# Patient Record
Sex: Male | Born: 1960 | Hispanic: Refuse to answer | Smoking: Never smoker
Health system: Southern US, Community
[De-identification: ages and names within clinical notes are randomized; demographics above are authoritative.]

## PROBLEM LIST (undated history)

## (undated) DIAGNOSIS — S83209A Unspecified tear of unspecified meniscus, current injury, unspecified knee, initial encounter: Secondary | ICD-10-CM

## (undated) DIAGNOSIS — M1990 Unspecified osteoarthritis, unspecified site: Secondary | ICD-10-CM

## (undated) DIAGNOSIS — E042 Nontoxic multinodular goiter: Secondary | ICD-10-CM

## (undated) DIAGNOSIS — E0590 Thyrotoxicosis, unspecified without thyrotoxic crisis or storm: Secondary | ICD-10-CM

---

## 1982-11-09 HISTORY — PX: OTHER SURGICAL HISTORY: SHX169

## 1998-02-11 ENCOUNTER — Ambulatory Visit (HOSPITAL_COMMUNITY): Admission: RE | Admit: 1998-02-11 | Discharge: 1998-02-11 | Payer: Self-pay | Admitting: *Deleted

## 1998-12-19 ENCOUNTER — Ambulatory Visit (HOSPITAL_COMMUNITY): Admission: RE | Admit: 1998-12-19 | Discharge: 1998-12-19 | Payer: Self-pay | Admitting: Obstetrics and Gynecology

## 1999-01-19 ENCOUNTER — Ambulatory Visit (HOSPITAL_COMMUNITY): Admission: RE | Admit: 1999-01-19 | Discharge: 1999-01-19 | Payer: Self-pay | Admitting: Obstetrics and Gynecology

## 1999-12-01 ENCOUNTER — Emergency Department (HOSPITAL_COMMUNITY): Admission: EM | Admit: 1999-12-01 | Discharge: 1999-12-01 | Payer: Self-pay | Admitting: *Deleted

## 2002-03-30 ENCOUNTER — Encounter: Payer: Self-pay | Admitting: Emergency Medicine

## 2002-03-30 ENCOUNTER — Emergency Department (HOSPITAL_COMMUNITY): Admission: EM | Admit: 2002-03-30 | Discharge: 2002-03-30 | Payer: Self-pay | Admitting: Emergency Medicine

## 2006-05-05 ENCOUNTER — Ambulatory Visit: Payer: Self-pay | Admitting: Internal Medicine

## 2006-06-16 ENCOUNTER — Ambulatory Visit: Payer: Self-pay | Admitting: Internal Medicine

## 2008-01-25 ENCOUNTER — Encounter: Payer: Self-pay | Admitting: *Deleted

## 2008-01-25 DIAGNOSIS — S42309A Unspecified fracture of shaft of humerus, unspecified arm, initial encounter for closed fracture: Secondary | ICD-10-CM | POA: Insufficient documentation

## 2008-01-25 DIAGNOSIS — F411 Generalized anxiety disorder: Secondary | ICD-10-CM | POA: Insufficient documentation

## 2008-01-25 DIAGNOSIS — J309 Allergic rhinitis, unspecified: Secondary | ICD-10-CM | POA: Insufficient documentation

## 2008-01-25 DIAGNOSIS — Z8719 Personal history of other diseases of the digestive system: Secondary | ICD-10-CM

## 2008-01-25 DIAGNOSIS — B351 Tinea unguium: Secondary | ICD-10-CM

## 2008-01-25 DIAGNOSIS — G478 Other sleep disorders: Secondary | ICD-10-CM | POA: Insufficient documentation

## 2012-03-08 ENCOUNTER — Other Ambulatory Visit: Payer: Self-pay | Admitting: Internal Medicine

## 2012-03-08 DIAGNOSIS — E041 Nontoxic single thyroid nodule: Secondary | ICD-10-CM

## 2012-03-15 ENCOUNTER — Ambulatory Visit
Admission: RE | Admit: 2012-03-15 | Discharge: 2012-03-15 | Disposition: A | Discharge status: Home / Self Care | Payer: BC Managed Care – PPO | Source: Ambulatory Visit | Attending: Internal Medicine | Admitting: Internal Medicine

## 2012-03-15 DIAGNOSIS — E041 Nontoxic single thyroid nodule: Secondary | ICD-10-CM

## 2012-04-12 ENCOUNTER — Other Ambulatory Visit: Payer: Self-pay | Admitting: Orthopedic Surgery

## 2012-04-12 ENCOUNTER — Encounter (HOSPITAL_BASED_OUTPATIENT_CLINIC_OR_DEPARTMENT_OTHER): Payer: Self-pay | Admitting: *Deleted

## 2012-04-12 NOTE — Progress Notes (Signed)
NPO AFTER MN. ARRIVES AT 1100. (PT SON HAS 5TH GRADE GRADUATION DOS) NEEDS HG AND EKG.

## 2012-04-13 ENCOUNTER — Encounter (HOSPITAL_BASED_OUTPATIENT_CLINIC_OR_DEPARTMENT_OTHER): Payer: Self-pay | Admitting: Anesthesiology

## 2012-04-13 ENCOUNTER — Encounter (HOSPITAL_BASED_OUTPATIENT_CLINIC_OR_DEPARTMENT_OTHER)
Admission: RE | Disposition: A | Discharge status: Home / Self Care | Payer: Self-pay | Source: Ambulatory Visit | Attending: Orthopedic Surgery

## 2012-04-13 ENCOUNTER — Ambulatory Visit (HOSPITAL_BASED_OUTPATIENT_CLINIC_OR_DEPARTMENT_OTHER): Payer: BC Managed Care – PPO | Admitting: Anesthesiology

## 2012-04-13 ENCOUNTER — Ambulatory Visit (HOSPITAL_BASED_OUTPATIENT_CLINIC_OR_DEPARTMENT_OTHER)
Admission: RE | Admit: 2012-04-13 | Discharge: 2012-04-13 | Disposition: A | Discharge status: Home / Self Care | Payer: BC Managed Care – PPO | Source: Ambulatory Visit | Attending: Orthopedic Surgery | Admitting: Orthopedic Surgery

## 2012-04-13 ENCOUNTER — Encounter (HOSPITAL_BASED_OUTPATIENT_CLINIC_OR_DEPARTMENT_OTHER): Payer: Self-pay | Admitting: *Deleted

## 2012-04-13 DIAGNOSIS — M659 Unspecified synovitis and tenosynovitis, unspecified site: Secondary | ICD-10-CM | POA: Insufficient documentation

## 2012-04-13 DIAGNOSIS — Z9889 Other specified postprocedural states: Secondary | ICD-10-CM

## 2012-04-13 DIAGNOSIS — Z7982 Long term (current) use of aspirin: Secondary | ICD-10-CM | POA: Insufficient documentation

## 2012-04-13 DIAGNOSIS — X58XXXA Exposure to other specified factors, initial encounter: Secondary | ICD-10-CM | POA: Insufficient documentation

## 2012-04-13 DIAGNOSIS — Z79899 Other long term (current) drug therapy: Secondary | ICD-10-CM | POA: Insufficient documentation

## 2012-04-13 DIAGNOSIS — M129 Arthropathy, unspecified: Secondary | ICD-10-CM | POA: Insufficient documentation

## 2012-04-13 DIAGNOSIS — IMO0002 Reserved for concepts with insufficient information to code with codable children: Secondary | ICD-10-CM | POA: Insufficient documentation

## 2012-04-13 HISTORY — DX: Thyrotoxicosis, unspecified without thyrotoxic crisis or storm: E05.90

## 2012-04-13 HISTORY — DX: Unspecified osteoarthritis, unspecified site: M19.90

## 2012-04-13 HISTORY — DX: Nontoxic multinodular goiter: E04.2

## 2012-04-13 HISTORY — PX: KNEE ARTHROSCOPY: SHX127

## 2012-04-13 HISTORY — DX: Unspecified tear of unspecified meniscus, current injury, unspecified knee, initial encounter: S83.209A

## 2012-04-13 SURGERY — ARTHROSCOPY, KNEE
Anesthesia: General | Site: Knee | Laterality: Left | Wound class: Clean

## 2012-04-13 MED ORDER — FENTANYL CITRATE 0.05 MG/ML IJ SOLN: 25.0000 ug | INTRAMUSCULAR | Status: DC | PRN

## 2012-04-13 MED ORDER — ONDANSETRON HCL 4 MG/2ML IJ SOLN
INTRAMUSCULAR | Status: DC | PRN
  Administered 2012-04-13 (×2): 2 mg via INTRAVENOUS

## 2012-04-13 MED ORDER — OXYCODONE-ACETAMINOPHEN 7.5-325 MG PO TABS: 1.0000 | ORAL_TABLET | ORAL | Status: AC | PRN

## 2012-04-13 MED ORDER — MORPHINE SULFATE 4 MG/ML IJ SOLN
INTRAMUSCULAR | Status: DC | PRN
  Administered 2012-04-13: 4 mg

## 2012-04-13 MED ORDER — METHOCARBAMOL 500 MG PO TABS: 500.0000 mg | ORAL_TABLET | Freq: Four times a day (QID) | ORAL | Status: AC

## 2012-04-13 MED ORDER — BUPIVACAINE-EPINEPHRINE 0.5% -1:200000 IJ SOLN
INTRAMUSCULAR | Status: DC | PRN
  Administered 2012-04-13: 20 mL

## 2012-04-13 MED ORDER — SODIUM CHLORIDE 0.9 % IR SOLN
Status: DC | PRN
  Administered 2012-04-13: 13:00:00

## 2012-04-13 MED ORDER — FENTANYL CITRATE 0.05 MG/ML IJ SOLN
INTRAMUSCULAR | Status: DC | PRN
  Administered 2012-04-13: 50 ug via INTRAVENOUS
  Administered 2012-04-13: 25 ug via INTRAVENOUS
  Administered 2012-04-13 (×2): 50 ug via INTRAVENOUS
  Administered 2012-04-13: 25 ug via INTRAVENOUS

## 2012-04-13 MED ORDER — DEXAMETHASONE SODIUM PHOSPHATE 4 MG/ML IJ SOLN
INTRAMUSCULAR | Status: DC | PRN
  Administered 2012-04-13: 10 mg via INTRAVENOUS

## 2012-04-13 MED ORDER — MIDAZOLAM HCL 5 MG/5ML IJ SOLN
INTRAMUSCULAR | Status: DC | PRN
  Administered 2012-04-13 (×2): 1 mg via INTRAVENOUS

## 2012-04-13 MED ORDER — POVIDONE-IODINE 7.5 % EX SOLN: Freq: Once | CUTANEOUS | Status: DC

## 2012-04-13 MED ORDER — DROPERIDOL 2.5 MG/ML IJ SOLN
INTRAMUSCULAR | Status: DC | PRN
  Administered 2012-04-13: 0.625 mg via INTRAVENOUS

## 2012-04-13 MED ORDER — OXYCODONE-ACETAMINOPHEN 5-325 MG PO TABS
1.0000 | ORAL_TABLET | Freq: Once | ORAL | Status: AC
  Administered 2012-04-13: 1 via ORAL

## 2012-04-13 MED ORDER — PROMETHAZINE HCL 25 MG/ML IJ SOLN: 6.2500 mg | INTRAMUSCULAR | Status: DC | PRN

## 2012-04-13 MED ORDER — SODIUM CHLORIDE 0.9 % IR SOLN
Status: DC | PRN
  Administered 2012-04-13: 12000 mL

## 2012-04-13 MED ORDER — PROPOFOL 10 MG/ML IV EMUL
INTRAVENOUS | Status: DC | PRN
  Administered 2012-04-13: 300 mg via INTRAVENOUS

## 2012-04-13 MED ORDER — LIDOCAINE HCL (CARDIAC) 20 MG/ML IV SOLN
INTRAVENOUS | Status: DC | PRN
  Administered 2012-04-13: 80 mg via INTRAVENOUS

## 2012-04-13 MED ORDER — METOCLOPRAMIDE HCL 5 MG/ML IJ SOLN
INTRAMUSCULAR | Status: DC | PRN
  Administered 2012-04-13: 5 mg via INTRAVENOUS

## 2012-04-13 MED ORDER — LACTATED RINGERS IV SOLN
INTRAVENOUS | Status: DC
  Administered 2012-04-13 (×2): via INTRAVENOUS

## 2012-04-13 MED ORDER — KETOROLAC TROMETHAMINE 30 MG/ML IJ SOLN
INTRAMUSCULAR | Status: DC | PRN
  Administered 2012-04-13: 30 mg via INTRAVENOUS

## 2012-04-13 MED ORDER — KETOROLAC TROMETHAMINE 30 MG/ML IJ SOLN: 15.0000 mg | Freq: Once | INTRAMUSCULAR | Status: DC | PRN

## 2012-04-13 SURGICAL SUPPLY — 47 items
BANDAGE ELASTIC 6 VELCRO ST LF (GAUZE/BANDAGES/DRESSINGS) ×4 IMPLANT
BANDAGE ESMARK 6X9 LF (GAUZE/BANDAGES/DRESSINGS) ×1 IMPLANT
BANDAGE GAUZE ELAST BULKY 4 IN (GAUZE/BANDAGES/DRESSINGS) ×2 IMPLANT
BLADE 4.2CUDA (BLADE) IMPLANT
BLADE CUDA 5.5 (BLADE) IMPLANT
BLADE CUDA SHAVER 3.5 (BLADE) ×2 IMPLANT
BLADE CUTTER GATOR 3.5 (BLADE) IMPLANT
BLADE GREAT WHITE 4.2 (BLADE) IMPLANT
BNDG ESMARK 6X9 LF (GAUZE/BANDAGES/DRESSINGS) ×2
CANISTER SUCT LVC 12 LTR MEDI- (MISCELLANEOUS) ×2 IMPLANT
CANISTER SUCTION 1200CC (MISCELLANEOUS) ×2 IMPLANT
CLOTH BEACON ORANGE TIMEOUT ST (SAFETY) ×2 IMPLANT
DRAPE ARTHROSCOPY W/POUCH 114 (DRAPES) ×2 IMPLANT
DRAPE LG THREE QUARTER DISP (DRAPES) ×2 IMPLANT
DRSG EMULSION OIL 3X3 NADH (GAUZE/BANDAGES/DRESSINGS) ×2 IMPLANT
DURAPREP 26ML APPLICATOR (WOUND CARE) ×2 IMPLANT
ELECT MENISCUS 165MM 90D (ELECTRODE) IMPLANT
ELECT REM PT RETURN 9FT ADLT (ELECTROSURGICAL)
ELECTRODE REM PT RTRN 9FT ADLT (ELECTROSURGICAL) IMPLANT
GAUZE SPONGE 4X4 12PLY STRL LF (GAUZE/BANDAGES/DRESSINGS) ×2 IMPLANT
GLOVE BIOGEL PI IND STRL 8 (GLOVE) ×2 IMPLANT
GLOVE BIOGEL PI INDICATOR 8 (GLOVE) ×2
GLOVE ECLIPSE 8.0 STRL XLNG CF (GLOVE) ×4 IMPLANT
GLOVE INDICATOR 8.0 STRL GRN (GLOVE) ×4 IMPLANT
GOWN PREVENTION PLUS LG XLONG (DISPOSABLE) ×4 IMPLANT
GOWN STRL REIN XL XLG (GOWN DISPOSABLE) ×2 IMPLANT
IV NS IRRIG 3000ML ARTHROMATIC (IV SOLUTION) ×4 IMPLANT
KNEE WRAP E Z 3 GEL PACK (MISCELLANEOUS) ×2 IMPLANT
NDL SAFETY ECLIPSE 18X1.5 (NEEDLE) ×1 IMPLANT
NEEDLE HYPO 18GX1.5 BLUNT FILL (NEEDLE) ×2 IMPLANT
NEEDLE HYPO 18GX1.5 SHARP (NEEDLE) ×1
PACK ARTHROSCOPY DSU (CUSTOM PROCEDURE TRAY) ×2 IMPLANT
PACK BASIN DAY SURGERY FS (CUSTOM PROCEDURE TRAY) ×2 IMPLANT
PADDING CAST ABS 4INX4YD NS (CAST SUPPLIES) ×1
PADDING CAST ABS COTTON 4X4 ST (CAST SUPPLIES) ×1 IMPLANT
PENCIL BUTTON HOLSTER BLD 10FT (ELECTRODE) IMPLANT
SET ARTHROSCOPY TUBING (MISCELLANEOUS) ×1
SET ARTHROSCOPY TUBING LN (MISCELLANEOUS) ×1 IMPLANT
SPONGE GAUZE 4X4 12PLY (GAUZE/BANDAGES/DRESSINGS) ×2 IMPLANT
SUT ETHIBOND 2 OS 4 DA (SUTURE) IMPLANT
SUT ETHILON 4 0 PS 2 18 (SUTURE) ×2 IMPLANT
SUT VIC AB 0 CT1 36 (SUTURE) IMPLANT
SUT VIC AB 2-0 PS2 27 (SUTURE) IMPLANT
SYRINGE 10CC LL (SYRINGE) ×2 IMPLANT
TOWEL OR 17X24 6PK STRL BLUE (TOWEL DISPOSABLE) ×2 IMPLANT
WAND 90 DEG TURBOVAC W/CORD (SURGICAL WAND) IMPLANT
WATER STERILE IRR 500ML POUR (IV SOLUTION) ×2 IMPLANT

## 2012-04-13 NOTE — Brief Op Note (Signed)
04/13/2012  2:04 PM  PATIENT:  Samuel Bishop  51 y.o. male  PRE-OPERATIVE DIAGNOSIS:  LEFT KNEE TORN MEDIAL MENISCUS  POST-OPERATIVE DIAGNOSIS:  LEFT KNEE TORN MEDIAL MENISCUS  PROCEDURE:  Procedure(s) (LRB): ARTHROSCOPY KNEE (Left)with partial medial meniscectomy, shaving medial femoral condyle and patella  SURGEON:  Surgeon(s) and Role:    * Drucilla Schmidt, MD - Primary  PHYSICIAN ASSISTANT:   ASSISTANTS: nurse  ANESTHESIA:   general  EBL:  Total I/O In: 1400 [I.V.:1400] Out: -   BLOOD ADMINISTERED:none  DRAINS: none   LOCAL MEDICATIONS USED:  MARCAINE     SPECIMEN:  No Specimen  DISPOSITION OF SPECIMEN:  N/A  COUNTS:  YES  TOURNIQUET:   Total Tourniquet Time Documented: Thigh (Left) - 69 minutes  DICTATION: .Other Dictation: Dictation Number [id number]  PLAN OF CARE: Discharge to home after PACU  PATIENT DISPOSITION:  PACU - hemodynamically stable.   Delay start of Pharmacological VTE agent (>24hrs) due to surgical blood loss or risk of bleeding: yes

## 2012-04-13 NOTE — Transfer of Care (Signed)
Immediate Anesthesia Transfer of Care Note  Patient: Samuel Bishop  Procedure(s) Performed: Procedure(s) (LRB): ARTHROSCOPY KNEE (Left)  Patient Location: PACU  Anesthesia Type: General  Level of Consciousness: drowsy, arouses to name, follows commands  Airway & Oxygen Therapy: Patient Spontanous Breathing and Patient connected to face mask oxygen  Post-op Assessment: Report given to PACU RN and Post -op Vital signs reviewed and stable  Post vital signs: Reviewed and stable  Complications: No apparent anesthesia complications

## 2012-04-13 NOTE — Anesthesia Procedure Notes (Signed)
Procedure Name: LMA Insertion Date/Time: 04/13/2012 12:50 PM Performed by: Norva Pavlov Pre-anesthesia Checklist: Patient identified, Emergency Drugs available, Suction available and Patient being monitored Patient Re-evaluated:Patient Re-evaluated prior to inductionOxygen Delivery Method: Circle System Utilized Preoxygenation: Pre-oxygenation with 100% oxygen Intubation Type: IV induction Ventilation: Mask ventilation without difficulty LMA: LMA with gastric port inserted LMA Size: 5.0 Number of attempts: 1 Airway Equipment and Method: bite block Placement Confirmation: positive ETCO2 Tube secured with: Tape Dental Injury: Teeth and Oropharynx as per pre-operative assessment

## 2012-04-13 NOTE — Anesthesia Preprocedure Evaluation (Addendum)
Anesthesia Evaluation  Patient identified by MRN, date of birth, ID band Patient awake    Reviewed: Allergy & Precautions, H&P , NPO status , Patient's Chart, lab work & pertinent test results  Airway Mallampati: II TM Distance: <3 FB Neck ROM: Full    Dental No notable dental hx.    Pulmonary neg pulmonary ROS,  breath sounds clear to auscultation  Pulmonary exam normal       Cardiovascular negative cardio ROS  Rhythm:Regular Rate:Normal     Neuro/Psych negative neurological ROS  negative psych ROS   GI/Hepatic negative GI ROS, Neg liver ROS,   Endo/Other  Hyperthyroidism Morbid obesity  Renal/GU negative Renal ROS  negative genitourinary   Musculoskeletal negative musculoskeletal ROS (+)   Abdominal   Peds negative pediatric ROS (+)  Hematology negative hematology ROS (+)   Anesthesia Other Findings   Reproductive/Obstetrics negative OB ROS                          Anesthesia Physical Anesthesia Plan  ASA: II  Anesthesia Plan: General   Post-op Pain Management:    Induction: Intravenous  Airway Management Planned: LMA  Additional Equipment:   Intra-op Plan:   Post-operative Plan:   Informed Consent: I have reviewed the patients History and Physical, chart, labs and discussed the procedure including the risks, benefits and alternatives for the proposed anesthesia with the patient or authorized representative who has indicated his/her understanding and acceptance.   Dental advisory given  Plan Discussed with: CRNA  Anesthesia Plan Comments:        Anesthesia Quick Evaluation

## 2012-04-13 NOTE — H&P (Signed)
Samuel Bishop is an 51 y.o. male.   Chief Complaint: painful lt knee HPMRI  Demonstrates torn medial meniscus  Past Medical History  Diagnosis Date  . Acute meniscal tear of knee LEFT KNEE  . Multiple thyroid nodules     BILATERAL   . Hyperthyroidism   . 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 Left tendon repair 1984    History reviewed. No pertinent family history. Social History:  reports that he has never smoked. He has never used smokeless tobacco. He reports that he drinks alcohol. His drug history not on file.  Allergies: No Known Allergies  Medications Prior to Admission  Medication Sig Dispense Refill  . aspirin 81 MG tablet Take 81 mg by mouth daily.      Marland Kitchen buPROPion (WELLBUTRIN XL) 150 MG 24 hr tablet Take 150 mg by mouth daily.      . Coenzyme Q10 (COQ10 PO) Take by mouth daily.      . fish oil-omega-3 fatty acids 1000 MG capsule Take 2 g by mouth daily.      Marland Kitchen glucosamine-chondroitin 500-400 MG tablet Take 2 tablets by mouth daily.      . meloxicam (MOBIC) 15 MG tablet Take 15 mg by mouth daily.      . ALBUTEROL IN Inhale into the lungs as needed.        No results found for this or any previous visit (from the past 48 hour(s)). No results found.  ROS  Height 6\' 2"  (1.88 m), weight 136.079 kg (300 lb). Physical Exam  Constitutional: He is oriented to person, place, and time. He appears well-developed and well-nourished.  HENT:  Head: Normocephalic and atraumatic.  Right Ear: External ear normal.  Left Ear: External ear normal.  Nose: Nose normal.  Mouth/Throat: Oropharynx is clear and moist.  Eyes: Conjunctivae and EOM are normal. Pupils are equal, round, and reactive to light.  Neck: Normal range of motion. Neck supple.  Cardiovascular: Normal rate, regular rhythm, normal heart sounds and intact distal pulses.   Respiratory: Effort normal and breath sounds normal.  GI: Soft. Bowel sounds are normal.    Musculoskeletal: Normal range of motion.       Lt knee--effusion and tender medial joint line  Neurological: He is alert and oriented to person, place, and time. He has normal reflexes.  Skin: Skin is warm and dry.  Psychiatric: He has a normal mood and affect. His behavior is normal. Judgment and thought content normal.     Assessment/Plan Torn medial meniscus lt knee Lt knee arthroscopy with partial medial meniscectomy  Arvetta Araque P 04/13/2012, 12:37 PM

## 2012-04-13 NOTE — Discharge Instructions (Signed)
  Post Anesthesia Home Care Instructions  Activity: Get plenty of rest for the remainder of the day. A responsible adult should stay with you for 24 hours following the procedure.  For the next 24 hours, DO NOT: -Drive a car -Operate machinery -Drink alcoholic beverages -Take any medication unless instructed by your physician -Make any legal decisions or sign important papers.  Meals: Start with liquid foods such as gelatin or soup. Progress to regular foods as tolerated. Avoid greasy, spicy, heavy foods. If nausea and/or vomiting occur, drink only clear liquids until the nausea and/or vomiting subsides. Call your physician if vomiting continues.  Special Instructions/Symptoms: Your throat may feel dry or sore from the anesthesia or the breathing tube placed in your throat during surgery. If this causes discomfort, gargle with warm salt water. The discomfort should disappear within 24 hours.  INSTRUCTIONS FOR POSTOPERATIVE CARE KNEE ARTHROSCOPY (Dr. Aplington)   PAIN You may expect moderate pain in the affected knee for approximately two weeks.  The first two day will be the most uncomfortable.  A prescription pain medication has been provided to help with this.  Take as directed for the pain.  Pain may be reduced by applying ice packs and keeping the leg elevated on two pillows during the first 48 hours after surgery.  ACTIVITY Bed rest for approximately 24 hours is preferred.  You may go to the bathroom with help. After 48 you may start applying weight on the leg and walk in the house.  Do not do a great deal of walking until you are rechecked in the office in 3-5 days.  DRESSING Keep the dressing dry.  Do not remove the dressing until you come back for a recheck in the office.  The ace bandage may be re-wrapped when it becomes wrinkled or loosened.   SYMPTOMS TO REPORT TO YOUR DOCTOR  -Extreme pain not relieved by your pain medicine.  -Extreme swelling.  -Temperature above 101  degrees.  -Any change in the feeling, color or movement of your toes.    -Redness, heat, swelling or drainage from your incision sites.  EXERCISES Knee exercises should not begin until after you have been seen and evaluated at Dr. Aplington's office 3-5 days after your surgery.  Follow up with Dr. Aplington at his office in *** days. Call and make an appointment.     Patient Signature:  ________________________________________________________  Nurse's Signature:  ________________________________________________________   

## 2012-04-14 NOTE — Anesthesia Postprocedure Evaluation (Signed)
  Anesthesia Post-op Note  Patient: Samuel Bishop  Procedure(s) Performed: Procedure(s) (LRB): ARTHROSCOPY KNEE (Left)  Patient Location: PACU  Anesthesia Type: general  Level of Consciousness: awake and alert   Airway and Oxygen Therapy: Patient Spontanous Breathing  Post-op Pain: mild  Post-op Assessment: Post-op Vital signs reviewed, Patient's Cardiovascular Status Stable, Respiratory Function Stable, Patent Airway and No signs of Nausea or vomiting  Post-op Vital Signs: stable  Complications: No apparent anesthesia complications

## 2012-04-14 NOTE — Progress Notes (Signed)
Message left ok per patient 

## 2012-04-14 NOTE — Op Note (Signed)
Samuel Bishop, Samuel Bishop             ACCOUNT NO.:  [id number]  MEDICAL RECORD NO.:  [id number]  LOCATION:  PERIOP                        FACILITY:  [facility]  PHYSICIAN:  Marlowe Kays, M.D.  DATE OF BIRTH:  04-15-1961  DATE OF PROCEDURE:  04/13/2012 DATE OF DISCHARGE:                              OPERATIVE REPORT   POSTOPERATIVE DIAGNOSES: 1. Torn medial meniscus. 2. Osteoarthritis, left knee.  POSTOPERATIVE DIAGNOSES: 1. Torn medial meniscus. 2. Osteoarthritis, left knee.  OPERATION:  Left knee arthroscopy with 1. Partial medial meniscectomy. 2. Shaving of medial femoral condyle. 3. Shaving of patella.  SURGEON:  Marlowe Kays, M.D.  ASSISTANT:  Nurse.  ANESTHESIA:  General.  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  Accordingly, he is here today for the above-mentioned surgery.  PROCEDURE:  Satisfied general anesthesia, Ace wrap, and knee support to right lower extremity, pneumatic tourniquet applied to left lower extremity and tourniquet inflated to 350 mmHg after Esmarching out the leg non-sterilely.  Thigh stabilizer was applied.  Time-out was performed.  The left leg was then prepped with DuraPrep from stabilizer to ankle, draped in a sterile field.  Superior medial saline inflow. First, an anterolateral portal medial compartment joint was evaluated. He had some synovitis anteromedially associated with some grade 2 to 3 out of 4 chondromalacia in medial femoral condyle.  With a 3.5 shaver, I synovectomized the anteromedial joint which was somewhat hemorrhagic and shaved down the medial femoral condyle until smooth.  Looking posteriorly, extensive tearing was noted at the posterior curve.  The tear went all way back to the joint line.  I resected all of  the torn areas of the medial meniscus trying to preserve as much __________ normal meniscus as possible.  His ACL was intact.  Looking at the medial gutter and suprapatellar area, the lateral half of his patella looked fine, but the medial half was reasonably worn and I shaved this down to smooth as well.  On reversing portals, there was some mild synovitis in the lateral joint, but the meniscus and joint surfaces looked good and no operative correction was required.  I then irrigated the knee joint until clear and all fluid possible was removed.  I closed the anterior 2 portals with 4-0 nylon and then injected 20 mL of 0.50% Marcaine with adrenaline and 4 mg of morphine through the inflow apparatus which was removed and this portal closed with 4-0 nylon as well. Betadine, Adaptic, dry sterile dressing were applied.  He tolerated the procedure well, was taken to the recovery room in satisfied condition with no known complications.          ______________________________ Marlowe Kays, M.D.     JA/MEDQ  D:  04/13/2012  T:  04/14/2012  Job:  [id number]

## 2012-04-18 ENCOUNTER — Encounter (HOSPITAL_BASED_OUTPATIENT_CLINIC_OR_DEPARTMENT_OTHER): Payer: Self-pay | Admitting: Orthopedic Surgery

## 2012-04-19 ENCOUNTER — Encounter (HOSPITAL_BASED_OUTPATIENT_CLINIC_OR_DEPARTMENT_OTHER): Payer: Self-pay

## 2014-02-14 ENCOUNTER — Encounter: Payer: Self-pay | Admitting: Medical

## 2014-02-14 ENCOUNTER — Telehealth: Payer: Self-pay | Admitting: Medical

## 2014-02-14 NOTE — Telephone Encounter (Signed)
Patient was on the schedule but is now not on my schedule.

## 2014-02-14 NOTE — Telephone Encounter (Signed)
Apparently you are seeing this patient today

## 2015-01-23 ENCOUNTER — Ambulatory Visit (INDEPENDENT_AMBULATORY_CARE_PROVIDER_SITE_OTHER): Payer: BLUE CROSS/BLUE SHIELD | Admitting: Emergency Medicine

## 2015-01-23 VITALS — BP 140/86 | HR 86 | Temp 97.8°F | Resp 16 | Ht 74.0 in | Wt 330.0 lb

## 2015-01-23 DIAGNOSIS — J209 Acute bronchitis, unspecified: Secondary | ICD-10-CM

## 2015-01-23 DIAGNOSIS — J40 Bronchitis, not specified as acute or chronic: Secondary | ICD-10-CM | POA: Diagnosis not present

## 2015-01-23 DIAGNOSIS — J0140 Acute pansinusitis, unspecified: Secondary | ICD-10-CM | POA: Diagnosis not present

## 2015-01-23 MED ORDER — HYDROCOD POLST-CHLORPHEN POLST 10-8 MG/5ML PO LQCR: 5.0000 mL | Freq: Two times a day (BID) | ORAL | Status: DC | PRN

## 2015-01-23 MED ORDER — AMOXICILLIN-POT CLAVULANATE 875-125 MG PO TABS
1.0000 | ORAL_TABLET | Freq: Two times a day (BID) | ORAL | Status: AC
Start: 2015-01-23 — End: ?

## 2015-01-23 MED ORDER — PSEUDOEPHEDRINE-GUAIFENESIN ER 60-600 MG PO TB12: 1.0000 | ORAL_TABLET | Freq: Two times a day (BID) | ORAL | Status: AC

## 2015-01-23 MED ORDER — ALBUTEROL SULFATE HFA 108 (90 BASE) MCG/ACT IN AERS: 2.0000 | INHALATION_SPRAY | RESPIRATORY_TRACT | Status: AC | PRN

## 2015-01-23 NOTE — Addendum Note (Signed)
Addended by: Carmelina DaneANDERSON, Corey Caulfield S on: 01/23/2015 06:21 PM   Modules accepted: Level of Service

## 2015-01-23 NOTE — Progress Notes (Signed)
[facility] [address] [place] [address] [phone]  Date:  01/23/2015   Name:  Samuel Bishop   DOB:  Nov 29, 1960   MRN:  [id number]  PCP:  No PCP Per Patient    Chief Complaint: Headache and URI   History of Present Illness:  Samuel Bishop is a 54 y.o. very pleasant male patient who presents with the following:  Ill since Sunday with nasal congestion. Some wheezing.  No shortness of breath Cough productive purulent sputum No nausea or vomiting No stool change Fever no chills No improvement with over the counter medications or other home remedies.  Denies other complaint or health concern today.   Patient Active Problem List   Diagnosis Date Noted  . ONYCHOMYCOSIS 01/25/2008  . ANXIETY 01/25/2008  . ALLERGIC RHINITIS 01/25/2008  . SLEEP DEPRIVATION 01/25/2008  . FRACTURE, ARM, LEFT 01/25/2008  . GASTROESOPHAGEAL REFLUX DISEASE, HX OF 01/25/2008    Past Medical History  Diagnosis Date  . Acute meniscal tear of knee LEFT KNEE  . Multiple thyroid nodules     BILATERAL   . Hyperthyroidism   . 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 Left tendon repair  1984  . Knee arthroscopy  04/13/2012    Procedure: ARTHROSCOPY KNEE;  Surgeon: Drucilla Schmidt, MD;  Location: [facility];  Service: Orthopedics;  Laterality: Left;  PARTIAL MEDIAL MENISECTOMY WITH SHAVING OF THE MEDIAL FEMORAL CONDYLE AND PATELLA    History  Substance Use Topics  . Smoking status: Never Smoker   . Smokeless tobacco: Never Used  . Alcohol Use: 0.0 oz/week    0 Standard drinks or equivalent per week     Comment: 2 BOTTLES WINE PER WEEK    Family History  Problem Relation Age of Onset  . Cancer Father     No Known Allergies  Medication list has been reviewed and updated.  Current Outpatient Prescriptions on File Prior to Visit  Medication Sig Dispense Refill  . aspirin 81 MG  tablet Take 81 mg by mouth daily.    Marland Kitchen buPROPion (WELLBUTRIN XL) 150 MG 24 hr tablet Take 150 mg by mouth daily.    . Coenzyme Q10 (COQ10 PO) Take by mouth daily.    . fish oil-omega-3 fatty acids 1000 MG capsule Take 2 g by mouth daily.    Marland Kitchen glucosamine-chondroitin 500-400 MG tablet Take 2 tablets by mouth daily.    . ALBUTEROL IN Inhale into the lungs as needed.    . meloxicam (MOBIC) 15 MG tablet Take 15 mg by mouth daily.     No current facility-administered medications on file prior to visit.    Review of Systems:  As per HPI, otherwise negative.    Physical Examination: Filed Vitals:   01/23/15 1759  BP: 140/86  Pulse: 86  Temp: 97.8 F (36.6 C)  Resp: 16   Filed Vitals:   01/23/15 1759  Height:  (1.88 m)  Weight: 330 lb (149.687 kg)   Body mass index is 42.35 kg/(m^2). Ideal Body Weight: Weight in (lb) to have BMI = 25: 194.3  GEN: WDWN, NAD, Non-toxic, A & O x 3 HEENT: Atraumatic, Normocephalic. Neck supple. No masses, No LAD. Ears and Nose: No external deformity. CV: RRR, No M/G/R. No JVD. No thrill. No extra heart sounds. PULM: CTA B, no wheezes, crackles, rhonchi. No retractions. No resp. distress. No accessory muscle  use. ABD: S, NT, ND, +BS. No rebound. No HSM. EXTR: No c/c/e NEURO Normal gait.  PSYCH: Normally interactive. Conversant. Not depressed or anxious appearing.  Calm demeanor.    Assessment and Plan: Sinusitis Bronchitis with bronchospasm augmentin mucinex tussionex Albuterol  Signed,  Phillips OdorJeffery Sophea Rackham, MD

## 2015-01-23 NOTE — Patient Instructions (Signed)

## 2015-01-28 ENCOUNTER — Telehealth: Payer: Self-pay

## 2015-01-28 MED ORDER — HYDROCOD POLST-CHLORPHEN POLST 10-8 MG/5ML PO LQCR: 5.0000 mL | Freq: Two times a day (BID) | ORAL | Status: AC | PRN

## 2015-01-28 NOTE — Telephone Encounter (Signed)
Spoke to pt. Pt said the cough is better, but still irritating. It's not as productive. I can still hear some congestion when he talks. He has trouble if he has to talk a lot to clients.  Please advise on refill.

## 2015-01-28 NOTE — Telephone Encounter (Signed)
Notified pt. 

## 2015-01-28 NOTE — Telephone Encounter (Signed)
Requesting cough med refill   [facility] - [address]    [phone]

## 2015-01-28 NOTE — Telephone Encounter (Signed)
Will refill one time. Please advise pt he will need to come back in for any further refills as we will need to evaluate him at that point. [facility]

## 2015-01-28 NOTE — Telephone Encounter (Signed)
Left VM to call back 

## 2018-07-26 ENCOUNTER — Ambulatory Visit (INDEPENDENT_AMBULATORY_CARE_PROVIDER_SITE_OTHER): Payer: Self-pay | Admitting: Psychology

## 2018-07-26 DIAGNOSIS — F4321 Adjustment disorder with depressed mood: Secondary | ICD-10-CM

## 2020-01-26 ENCOUNTER — Other Ambulatory Visit: Payer: Self-pay

## 2020-01-26 ENCOUNTER — Ambulatory Visit: Payer: Self-pay | Attending: Internal Medicine

## 2020-01-26 DIAGNOSIS — Z23 Encounter for immunization: Secondary | ICD-10-CM

## 2020-01-26 NOTE — Progress Notes (Signed)
   Covid-19 Vaccination Clinic  Name:  Samuel Bishop    MRN: [id number] DOB: 1961/06/06  01/26/2020  Mr. Samuel Bishop was observed post Covid-19 immunization for 15 minutes without incident. He was provided with Vaccine Information Sheet and instruction to access the V-Safe system.   Mr. Samuel Bishop was instructed to call 911 with any severe reactions post vaccine: Marland Kitchen Difficulty breathing  . Swelling of face and throat  . A fast heartbeat  . A bad rash all over body  . Dizziness and weakness   Immunizations Administered    Name Date Dose VIS Date Route   Pfizer COVID-19 Vaccine 01/26/2020  1:13 PM 0.3 mL 10/20/2019 Intramuscular   Manufacturer: [employer], [employer]   Lot: [id number]   NDC: [address]-8

## 2020-02-20 ENCOUNTER — Ambulatory Visit: Payer: Self-pay | Attending: Internal Medicine

## 2020-02-20 DIAGNOSIS — Z23 Encounter for immunization: Secondary | ICD-10-CM

## 2020-02-20 NOTE — Progress Notes (Signed)
   Covid-19 Vaccination Clinic  Name:  Samuel Bishop    MRN: [id number] DOB: [birth year]  02/20/2020  [name] was observed post Covid-19 immunization for 15 minutes without incident. He was provided with Vaccine Information Sheet and instruction to access the V-Safe system.   [name] was instructed to call 911 with any severe reactions post vaccine: Marland Kitchen Difficulty breathing  . Swelling of face and throat  . A fast heartbeat  . A bad rash all over body  . Dizziness and weakness   Immunizations Administered    Name Date Dose VIS Date Route   Pfizer COVID-19 Vaccine 02/20/2020  2:11 PM 0.3 mL 10/20/2019 Intramuscular   Manufacturer: [employer], [employer]   Lot: [id number]   NDC: [address]-4

## 2020-07-04 ENCOUNTER — Other Ambulatory Visit: Payer: Self-pay | Admitting: Internal Medicine

## 2020-07-04 DIAGNOSIS — R1909 Other intra-abdominal and pelvic swelling, mass and lump: Secondary | ICD-10-CM

## 2020-07-05 ENCOUNTER — Ambulatory Visit (HOSPITAL_COMMUNITY): Payer: BLUE CROSS/BLUE SHIELD

## 2020-07-05 ENCOUNTER — Encounter (HOSPITAL_COMMUNITY): Payer: Self-pay

## 2020-07-11 ENCOUNTER — Other Ambulatory Visit: Payer: Self-pay | Admitting: Internal Medicine

## 2020-07-11 ENCOUNTER — Other Ambulatory Visit: Payer: Self-pay

## 2020-07-11 ENCOUNTER — Ambulatory Visit (HOSPITAL_COMMUNITY)
Admission: RE | Admit: 2020-07-11 | Discharge: 2020-07-11 | Disposition: A | Discharge status: Home / Self Care | Payer: BLUE CROSS/BLUE SHIELD | Source: Ambulatory Visit | Attending: Internal Medicine | Admitting: Internal Medicine

## 2020-07-11 DIAGNOSIS — R1909 Other intra-abdominal and pelvic swelling, mass and lump: Secondary | ICD-10-CM | POA: Insufficient documentation

## 2021-08-05 IMAGING — CT CT ABD-PELV W/O CM
2 of 4 series · 17 of 46 positions shown, 19 images · non-contrast
Comparison: None.

CLINICAL DATA: Palpable mass in the lower abdomen/upper pelvis for
2 weeks.

EXAM:
CT ABDOMEN AND PELVIS WITHOUT CONTRAST
TECHNIQUE: Multidetector CT imaging of the abdomen and pelvis was performed
following the standard protocol without IV contrast.

[Series 2: axial st · axial · 0.88mm/px · z∈[-558,-93]mm · 14 of 105 slices shown, 16 images]
[im 6/105  soft-tissue]
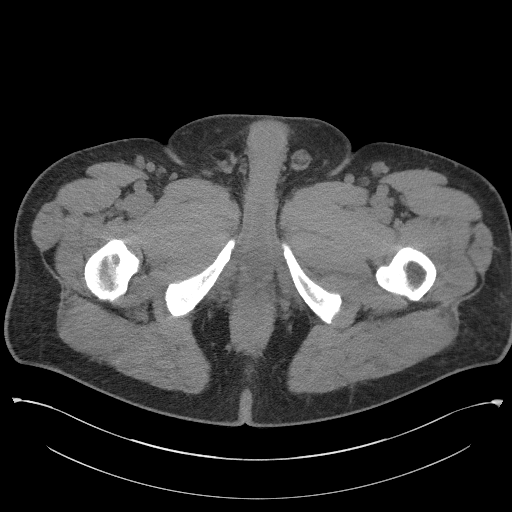
[im 6/105  bone]
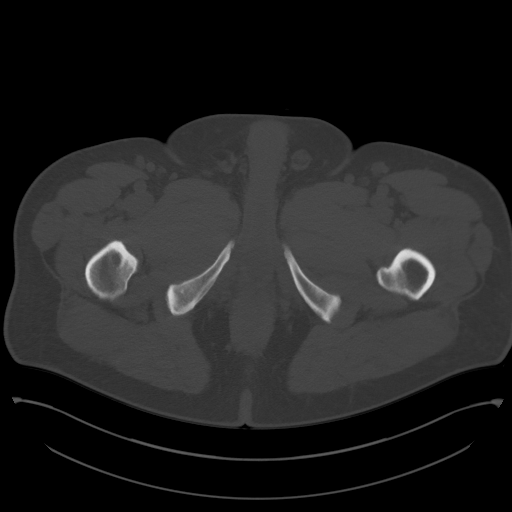
[im 12/105  soft-tissue]
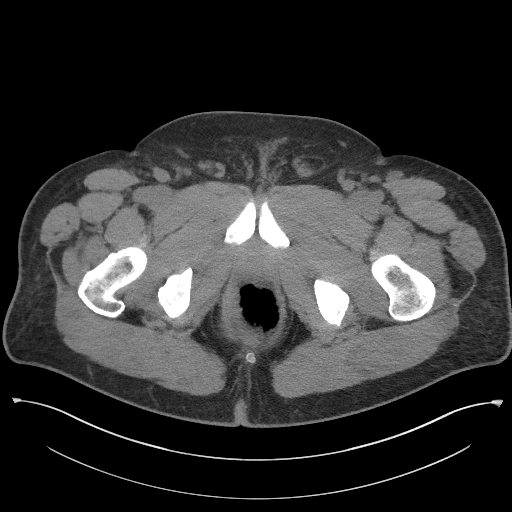
[im 24/105  soft-tissue]
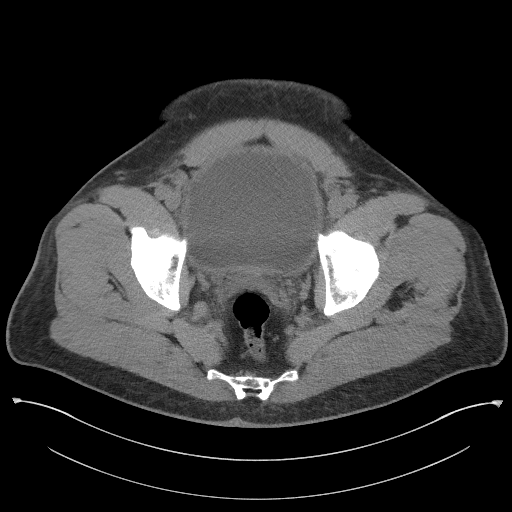
[im 29/105  soft-tissue]
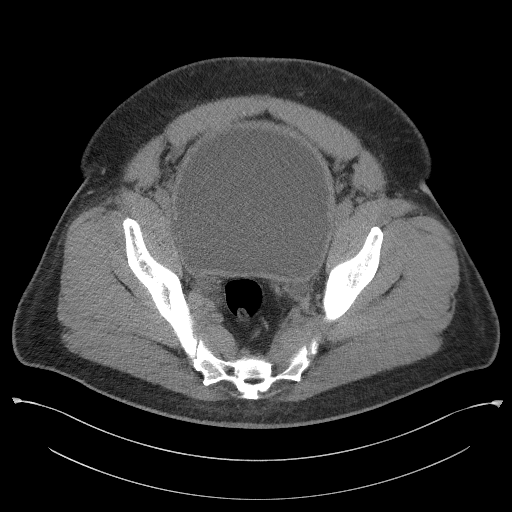
[im 35/105  soft-tissue]
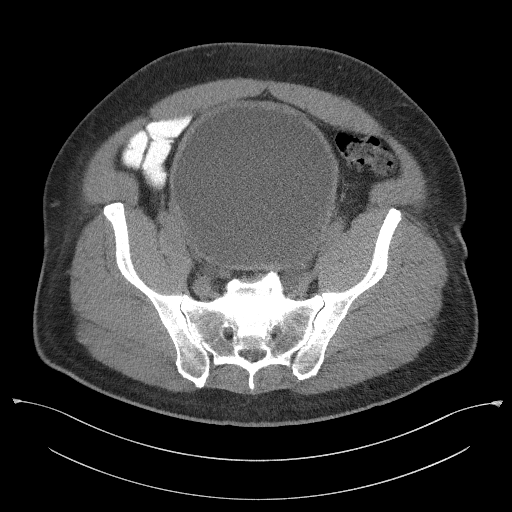
[im 41/105  soft-tissue]
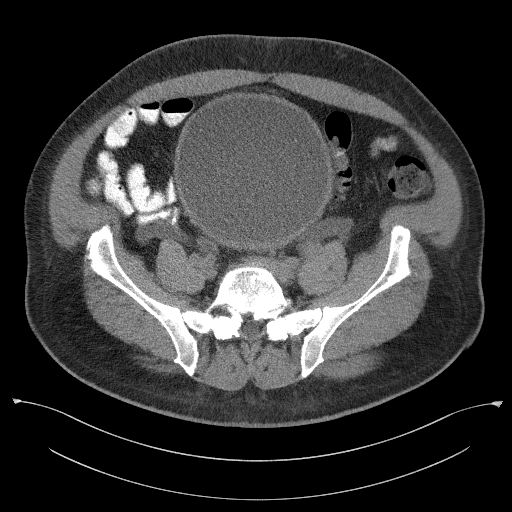
[im 47/105  soft-tissue]
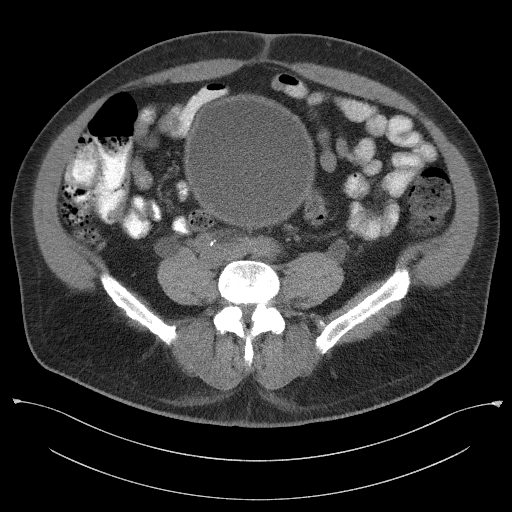
[im 58/105  soft-tissue]
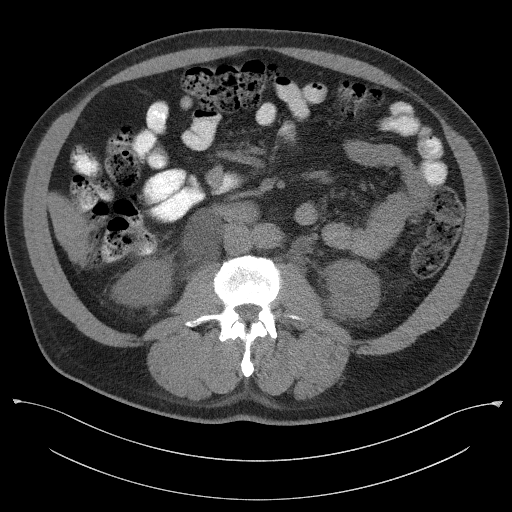
[im 64/105  soft-tissue]
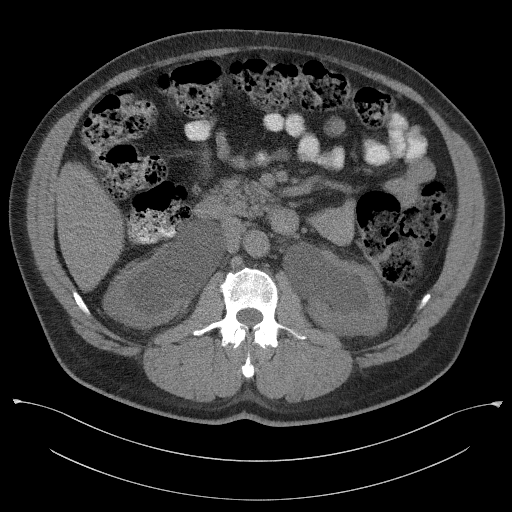
[im 64/105  bone]
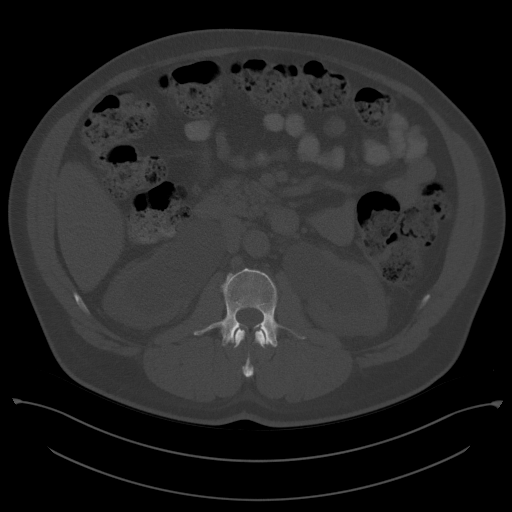
[im 70/105  soft-tissue]
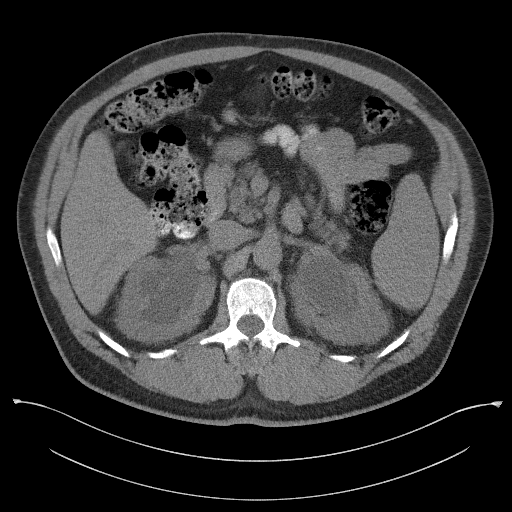
[im 76/105  soft-tissue]
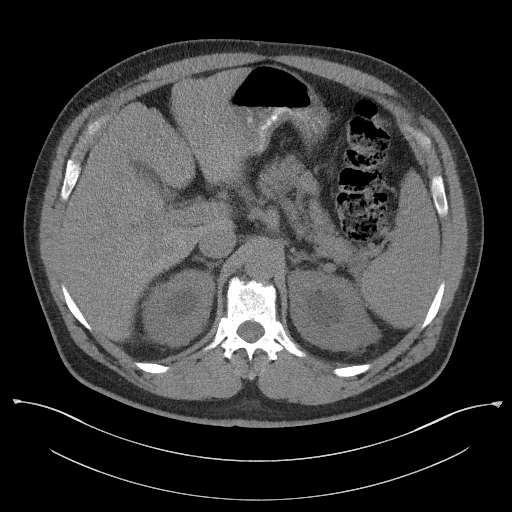
[im 81/105  soft-tissue]
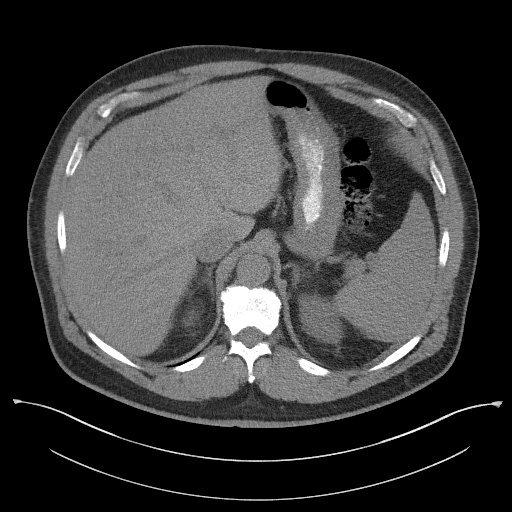
[im 93/105  soft-tissue]
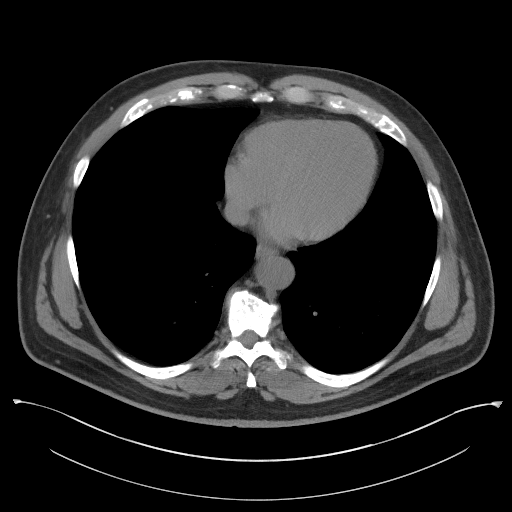
[im 99/105  soft-tissue]
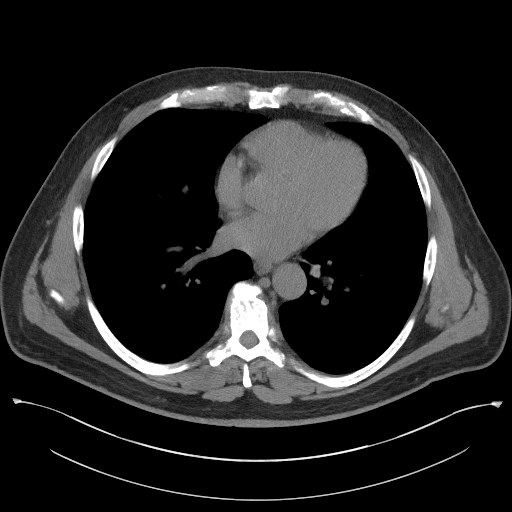

[Series 5: coronal st · coronal · 0.95mm/px · 3 of 120 slices shown]
[im 40/120  soft-tissue]
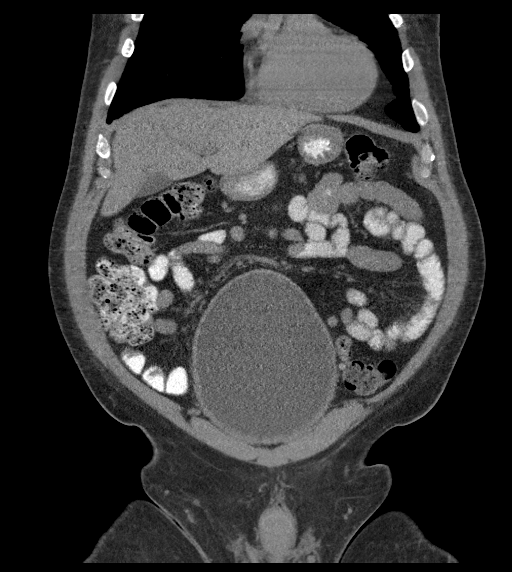
[im 53/120  soft-tissue]
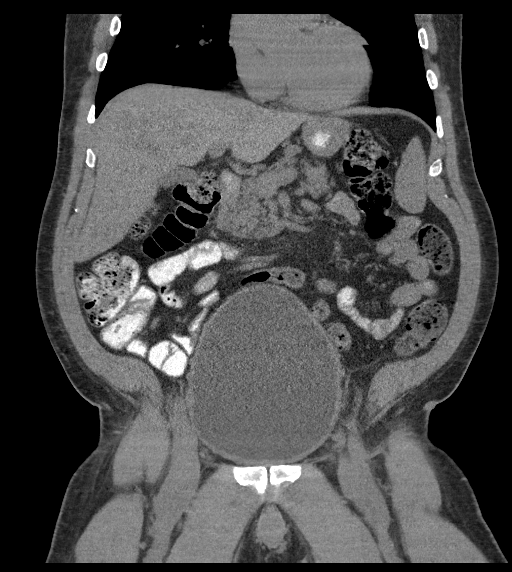
[im 67/120  soft-tissue]
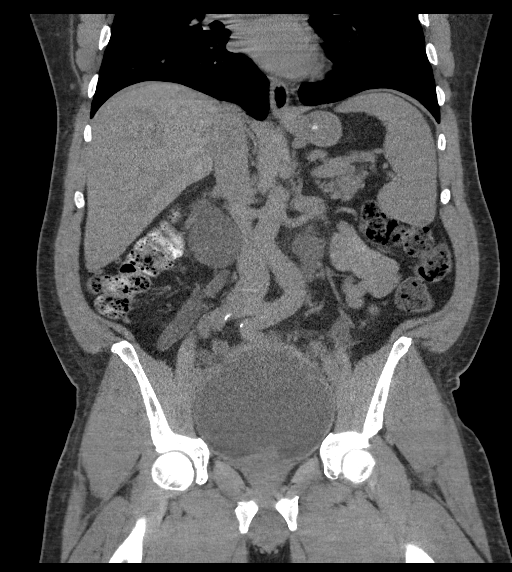

[17 of 46 positions shown; findings below may reference images not displayed]

FINDINGS: Lower chest: 4 mm right middle lobe pulmonary nodule, below size
threshold for follow-up in this patient listed as never smoker.

Hepatobiliary: No focal liver abnormality.No evidence of biliary
obstruction or stone.

Pancreas: Unremarkable.

Spleen: Unremarkable.

Adrenals/Urinary Tract: Negative adrenals. Bilateral
hydroureteronephrosis to the level of the distended urinary bladder.
The bladder wall is thickened and in the upper midline there is an
area of thinning consistent with shallow diverticulum. Perinephric
and pericystic stranding attributed to the urinary obstruction.

Stomach/Bowel:  No obstruction. No visible bowel inflammation.

Vascular/Lymphatic: No acute vascular abnormality. No mass or
adenopathy.

Reproductive:Mild enlargement of the central prostate projecting
into the bladder base.

Other: No ascites or pneumoperitoneum.

Musculoskeletal: No acute abnormalities. Spondylosis which is
generalized.
IMPRESSION: 1. Over distended bladder with bilateral hydroureteronephrosis. The
bladder extends above the umbilicus and correlates with the provided
history.
2. Enlarged central prostate projecting into the bladder base.
